# Patient Record
Sex: Female | Born: 1960 | Race: White | Hispanic: No | Smoking: Former smoker
Health system: Southern US, Community
[De-identification: ages and names within clinical notes are randomized; demographics above are authoritative.]

## PROBLEM LIST (undated history)

## (undated) DIAGNOSIS — J449 Chronic obstructive pulmonary disease, unspecified: Secondary | ICD-10-CM

## (undated) DIAGNOSIS — E785 Hyperlipidemia, unspecified: Secondary | ICD-10-CM

---

## 2017-04-03 DIAGNOSIS — Z7189 Other specified counseling: Secondary | ICD-10-CM | POA: Diagnosis not present

## 2017-04-03 DIAGNOSIS — E785 Hyperlipidemia, unspecified: Secondary | ICD-10-CM | POA: Diagnosis not present

## 2017-04-03 DIAGNOSIS — Z6831 Body mass index (BMI) 31.0-31.9, adult: Secondary | ICD-10-CM | POA: Diagnosis not present

## 2017-04-03 DIAGNOSIS — K5909 Other constipation: Secondary | ICD-10-CM | POA: Diagnosis not present

## 2017-04-03 DIAGNOSIS — N3281 Overactive bladder: Secondary | ICD-10-CM | POA: Diagnosis not present

## 2017-04-03 DIAGNOSIS — Z23 Encounter for immunization: Secondary | ICD-10-CM | POA: Diagnosis not present

## 2017-04-03 DIAGNOSIS — E039 Hypothyroidism, unspecified: Secondary | ICD-10-CM | POA: Diagnosis not present

## 2017-04-03 DIAGNOSIS — N951 Menopausal and female climacteric states: Secondary | ICD-10-CM | POA: Diagnosis not present

## 2017-04-17 DIAGNOSIS — J439 Emphysema, unspecified: Secondary | ICD-10-CM | POA: Diagnosis not present

## 2017-04-17 DIAGNOSIS — Z6830 Body mass index (BMI) 30.0-30.9, adult: Secondary | ICD-10-CM | POA: Diagnosis not present

## 2017-04-17 DIAGNOSIS — E039 Hypothyroidism, unspecified: Secondary | ICD-10-CM | POA: Diagnosis not present

## 2017-04-17 DIAGNOSIS — N3281 Overactive bladder: Secondary | ICD-10-CM | POA: Diagnosis not present

## 2017-04-17 DIAGNOSIS — K5909 Other constipation: Secondary | ICD-10-CM | POA: Diagnosis not present

## 2017-04-17 DIAGNOSIS — R079 Chest pain, unspecified: Secondary | ICD-10-CM | POA: Diagnosis not present

## 2017-04-17 DIAGNOSIS — N390 Urinary tract infection, site not specified: Secondary | ICD-10-CM | POA: Diagnosis not present

## 2017-04-17 DIAGNOSIS — E785 Hyperlipidemia, unspecified: Secondary | ICD-10-CM | POA: Diagnosis not present

## 2017-04-17 DIAGNOSIS — Z7189 Other specified counseling: Secondary | ICD-10-CM | POA: Diagnosis not present

## 2017-04-23 DIAGNOSIS — J449 Chronic obstructive pulmonary disease, unspecified: Secondary | ICD-10-CM | POA: Diagnosis not present

## 2017-05-14 DIAGNOSIS — R079 Chest pain, unspecified: Secondary | ICD-10-CM | POA: Diagnosis not present

## 2017-05-14 DIAGNOSIS — E785 Hyperlipidemia, unspecified: Secondary | ICD-10-CM | POA: Diagnosis not present

## 2017-05-14 DIAGNOSIS — Z79899 Other long term (current) drug therapy: Secondary | ICD-10-CM | POA: Diagnosis not present

## 2017-05-23 DIAGNOSIS — J449 Chronic obstructive pulmonary disease, unspecified: Secondary | ICD-10-CM | POA: Diagnosis not present

## 2017-05-29 DIAGNOSIS — E039 Hypothyroidism, unspecified: Secondary | ICD-10-CM | POA: Diagnosis not present

## 2017-05-29 DIAGNOSIS — Z6829 Body mass index (BMI) 29.0-29.9, adult: Secondary | ICD-10-CM | POA: Diagnosis not present

## 2017-05-29 DIAGNOSIS — E785 Hyperlipidemia, unspecified: Secondary | ICD-10-CM | POA: Diagnosis not present

## 2017-05-29 DIAGNOSIS — R69 Illness, unspecified: Secondary | ICD-10-CM | POA: Diagnosis not present

## 2017-05-29 DIAGNOSIS — Z7189 Other specified counseling: Secondary | ICD-10-CM | POA: Diagnosis not present

## 2017-05-29 DIAGNOSIS — J439 Emphysema, unspecified: Secondary | ICD-10-CM | POA: Diagnosis not present

## 2017-05-29 DIAGNOSIS — K219 Gastro-esophageal reflux disease without esophagitis: Secondary | ICD-10-CM | POA: Diagnosis not present

## 2017-06-02 DIAGNOSIS — Z9981 Dependence on supplemental oxygen: Secondary | ICD-10-CM | POA: Diagnosis not present

## 2017-06-02 DIAGNOSIS — J449 Chronic obstructive pulmonary disease, unspecified: Secondary | ICD-10-CM | POA: Diagnosis not present

## 2017-06-02 DIAGNOSIS — R0789 Other chest pain: Secondary | ICD-10-CM | POA: Diagnosis not present

## 2017-06-02 DIAGNOSIS — E7800 Pure hypercholesterolemia, unspecified: Secondary | ICD-10-CM | POA: Diagnosis not present

## 2017-06-02 DIAGNOSIS — Z79899 Other long term (current) drug therapy: Secondary | ICD-10-CM | POA: Diagnosis not present

## 2017-06-02 DIAGNOSIS — R079 Chest pain, unspecified: Secondary | ICD-10-CM | POA: Diagnosis not present

## 2017-06-02 DIAGNOSIS — Z7951 Long term (current) use of inhaled steroids: Secondary | ICD-10-CM | POA: Diagnosis not present

## 2017-06-02 DIAGNOSIS — Z87891 Personal history of nicotine dependence: Secondary | ICD-10-CM | POA: Diagnosis not present

## 2017-06-23 DIAGNOSIS — J449 Chronic obstructive pulmonary disease, unspecified: Secondary | ICD-10-CM | POA: Diagnosis not present

## 2017-07-24 DIAGNOSIS — J449 Chronic obstructive pulmonary disease, unspecified: Secondary | ICD-10-CM | POA: Diagnosis not present

## 2017-07-30 DIAGNOSIS — E039 Hypothyroidism, unspecified: Secondary | ICD-10-CM | POA: Diagnosis not present

## 2017-07-30 DIAGNOSIS — Z6830 Body mass index (BMI) 30.0-30.9, adult: Secondary | ICD-10-CM | POA: Diagnosis not present

## 2017-07-30 DIAGNOSIS — E785 Hyperlipidemia, unspecified: Secondary | ICD-10-CM | POA: Diagnosis not present

## 2017-07-30 DIAGNOSIS — N3281 Overactive bladder: Secondary | ICD-10-CM | POA: Diagnosis not present

## 2017-07-30 DIAGNOSIS — K219 Gastro-esophageal reflux disease without esophagitis: Secondary | ICD-10-CM | POA: Diagnosis not present

## 2017-07-30 DIAGNOSIS — R300 Dysuria: Secondary | ICD-10-CM | POA: Diagnosis not present

## 2017-07-30 DIAGNOSIS — R69 Illness, unspecified: Secondary | ICD-10-CM | POA: Diagnosis not present

## 2017-07-30 DIAGNOSIS — Z6829 Body mass index (BMI) 29.0-29.9, adult: Secondary | ICD-10-CM | POA: Diagnosis not present

## 2017-07-30 DIAGNOSIS — J439 Emphysema, unspecified: Secondary | ICD-10-CM | POA: Diagnosis not present

## 2017-07-30 DIAGNOSIS — Z7189 Other specified counseling: Secondary | ICD-10-CM | POA: Diagnosis not present

## 2017-08-06 DIAGNOSIS — J439 Emphysema, unspecified: Secondary | ICD-10-CM | POA: Diagnosis not present

## 2017-08-06 DIAGNOSIS — Z9181 History of falling: Secondary | ICD-10-CM | POA: Diagnosis not present

## 2017-08-06 DIAGNOSIS — N390 Urinary tract infection, site not specified: Secondary | ICD-10-CM | POA: Diagnosis not present

## 2017-08-14 DIAGNOSIS — J439 Emphysema, unspecified: Secondary | ICD-10-CM | POA: Diagnosis not present

## 2017-08-14 DIAGNOSIS — R079 Chest pain, unspecified: Secondary | ICD-10-CM | POA: Diagnosis not present

## 2017-08-14 DIAGNOSIS — Z7189 Other specified counseling: Secondary | ICD-10-CM | POA: Diagnosis not present

## 2017-08-14 DIAGNOSIS — K219 Gastro-esophageal reflux disease without esophagitis: Secondary | ICD-10-CM | POA: Diagnosis not present

## 2017-08-14 DIAGNOSIS — E785 Hyperlipidemia, unspecified: Secondary | ICD-10-CM | POA: Diagnosis not present

## 2017-08-14 DIAGNOSIS — Z6829 Body mass index (BMI) 29.0-29.9, adult: Secondary | ICD-10-CM | POA: Diagnosis not present

## 2017-08-21 DIAGNOSIS — J449 Chronic obstructive pulmonary disease, unspecified: Secondary | ICD-10-CM | POA: Diagnosis not present

## 2017-09-21 DIAGNOSIS — J449 Chronic obstructive pulmonary disease, unspecified: Secondary | ICD-10-CM | POA: Diagnosis not present

## 2017-09-24 DIAGNOSIS — Z7189 Other specified counseling: Secondary | ICD-10-CM | POA: Diagnosis not present

## 2017-09-24 DIAGNOSIS — Z6829 Body mass index (BMI) 29.0-29.9, adult: Secondary | ICD-10-CM | POA: Diagnosis not present

## 2017-09-24 DIAGNOSIS — J209 Acute bronchitis, unspecified: Secondary | ICD-10-CM | POA: Diagnosis not present

## 2017-09-24 DIAGNOSIS — J439 Emphysema, unspecified: Secondary | ICD-10-CM | POA: Diagnosis not present

## 2017-09-24 DIAGNOSIS — J441 Chronic obstructive pulmonary disease with (acute) exacerbation: Secondary | ICD-10-CM | POA: Diagnosis not present

## 2017-10-21 DIAGNOSIS — J449 Chronic obstructive pulmonary disease, unspecified: Secondary | ICD-10-CM | POA: Diagnosis not present

## 2017-11-19 DIAGNOSIS — Z1231 Encounter for screening mammogram for malignant neoplasm of breast: Secondary | ICD-10-CM | POA: Diagnosis not present

## 2017-11-21 DIAGNOSIS — J449 Chronic obstructive pulmonary disease, unspecified: Secondary | ICD-10-CM | POA: Diagnosis not present

## 2017-12-04 DIAGNOSIS — R0902 Hypoxemia: Secondary | ICD-10-CM | POA: Diagnosis not present

## 2017-12-04 DIAGNOSIS — R0602 Shortness of breath: Secondary | ICD-10-CM | POA: Diagnosis not present

## 2017-12-04 DIAGNOSIS — J449 Chronic obstructive pulmonary disease, unspecified: Secondary | ICD-10-CM | POA: Diagnosis not present

## 2017-12-04 DIAGNOSIS — G4733 Obstructive sleep apnea (adult) (pediatric): Secondary | ICD-10-CM | POA: Diagnosis not present

## 2017-12-04 DIAGNOSIS — Z87891 Personal history of nicotine dependence: Secondary | ICD-10-CM | POA: Diagnosis not present

## 2017-12-21 DIAGNOSIS — J449 Chronic obstructive pulmonary disease, unspecified: Secondary | ICD-10-CM | POA: Diagnosis not present

## 2018-01-21 DIAGNOSIS — J449 Chronic obstructive pulmonary disease, unspecified: Secondary | ICD-10-CM | POA: Diagnosis not present

## 2018-02-05 DIAGNOSIS — S4991XA Unspecified injury of right shoulder and upper arm, initial encounter: Secondary | ICD-10-CM | POA: Diagnosis not present

## 2018-02-05 DIAGNOSIS — J449 Chronic obstructive pulmonary disease, unspecified: Secondary | ICD-10-CM | POA: Diagnosis not present

## 2018-02-05 DIAGNOSIS — S76011A Strain of muscle, fascia and tendon of right hip, initial encounter: Secondary | ICD-10-CM | POA: Diagnosis not present

## 2018-02-05 DIAGNOSIS — S43401A Unspecified sprain of right shoulder joint, initial encounter: Secondary | ICD-10-CM | POA: Diagnosis not present

## 2018-02-05 DIAGNOSIS — K219 Gastro-esophageal reflux disease without esophagitis: Secondary | ICD-10-CM | POA: Diagnosis not present

## 2018-02-05 DIAGNOSIS — Z87891 Personal history of nicotine dependence: Secondary | ICD-10-CM | POA: Diagnosis not present

## 2018-02-05 DIAGNOSIS — S79911A Unspecified injury of right hip, initial encounter: Secondary | ICD-10-CM | POA: Diagnosis not present

## 2018-02-05 DIAGNOSIS — Z7982 Long term (current) use of aspirin: Secondary | ICD-10-CM | POA: Diagnosis not present

## 2018-02-05 DIAGNOSIS — Z79899 Other long term (current) drug therapy: Secondary | ICD-10-CM | POA: Diagnosis not present

## 2018-02-18 DIAGNOSIS — Z7189 Other specified counseling: Secondary | ICD-10-CM | POA: Diagnosis not present

## 2018-02-18 DIAGNOSIS — K219 Gastro-esophageal reflux disease without esophagitis: Secondary | ICD-10-CM | POA: Diagnosis not present

## 2018-02-18 DIAGNOSIS — E039 Hypothyroidism, unspecified: Secondary | ICD-10-CM | POA: Diagnosis not present

## 2018-02-18 DIAGNOSIS — J449 Chronic obstructive pulmonary disease, unspecified: Secondary | ICD-10-CM | POA: Diagnosis not present

## 2018-02-18 DIAGNOSIS — Z1379 Encounter for other screening for genetic and chromosomal anomalies: Secondary | ICD-10-CM | POA: Diagnosis not present

## 2018-02-18 DIAGNOSIS — R69 Illness, unspecified: Secondary | ICD-10-CM | POA: Diagnosis not present

## 2018-02-18 DIAGNOSIS — Z6830 Body mass index (BMI) 30.0-30.9, adult: Secondary | ICD-10-CM | POA: Diagnosis not present

## 2018-02-18 DIAGNOSIS — E785 Hyperlipidemia, unspecified: Secondary | ICD-10-CM | POA: Diagnosis not present

## 2018-02-21 DIAGNOSIS — J449 Chronic obstructive pulmonary disease, unspecified: Secondary | ICD-10-CM | POA: Diagnosis not present

## 2018-03-03 DIAGNOSIS — R69 Illness, unspecified: Secondary | ICD-10-CM | POA: Diagnosis not present

## 2018-03-03 DIAGNOSIS — J439 Emphysema, unspecified: Secondary | ICD-10-CM | POA: Diagnosis not present

## 2018-03-03 DIAGNOSIS — E039 Hypothyroidism, unspecified: Secondary | ICD-10-CM | POA: Diagnosis not present

## 2018-03-03 DIAGNOSIS — K219 Gastro-esophageal reflux disease without esophagitis: Secondary | ICD-10-CM | POA: Diagnosis not present

## 2018-03-03 DIAGNOSIS — R030 Elevated blood-pressure reading, without diagnosis of hypertension: Secondary | ICD-10-CM | POA: Diagnosis not present

## 2018-03-03 DIAGNOSIS — J449 Chronic obstructive pulmonary disease, unspecified: Secondary | ICD-10-CM | POA: Diagnosis not present

## 2018-03-03 DIAGNOSIS — E785 Hyperlipidemia, unspecified: Secondary | ICD-10-CM | POA: Diagnosis not present

## 2018-03-03 DIAGNOSIS — R0902 Hypoxemia: Secondary | ICD-10-CM | POA: Diagnosis not present

## 2018-03-03 DIAGNOSIS — E669 Obesity, unspecified: Secondary | ICD-10-CM | POA: Diagnosis not present

## 2018-03-23 DIAGNOSIS — J449 Chronic obstructive pulmonary disease, unspecified: Secondary | ICD-10-CM | POA: Diagnosis not present

## 2018-04-09 ENCOUNTER — Emergency Department
Admission: EM | Admit: 2018-04-09 | Discharge: 2018-04-09 | Disposition: A | Discharge status: Home / Self Care | Payer: Medicare HMO | Source: Home / Self Care

## 2018-04-09 ENCOUNTER — Encounter: Payer: Self-pay | Admitting: Emergency Medicine

## 2018-04-09 DIAGNOSIS — J029 Acute pharyngitis, unspecified: Secondary | ICD-10-CM

## 2018-04-09 DIAGNOSIS — Z008 Encounter for other general examination: Secondary | ICD-10-CM | POA: Diagnosis not present

## 2018-04-09 DIAGNOSIS — J438 Other emphysema: Secondary | ICD-10-CM

## 2018-04-09 DIAGNOSIS — J040 Acute laryngitis: Secondary | ICD-10-CM | POA: Diagnosis not present

## 2018-04-09 HISTORY — DX: Chronic obstructive pulmonary disease, unspecified: J44.9

## 2018-04-09 HISTORY — DX: Hyperlipidemia, unspecified: E78.5

## 2018-04-09 LAB — POCT INFLUENZA A/B
Influenza A, POC: NEGATIVE
Influenza B, POC: NEGATIVE

## 2018-04-09 LAB — POCT RAPID STREP A (OFFICE): RAPID STREP A SCREEN: NEGATIVE

## 2018-04-09 MED ORDER — BENZONATATE 100 MG PO CAPS: 100.0000 mg | ORAL_CAPSULE | Freq: Three times a day (TID) | ORAL | 0 refills | Status: DC | PRN

## 2018-04-09 MED ORDER — PREDNISONE 10 MG (21) PO TBPK: ORAL_TABLET | ORAL | 0 refills | Status: DC

## 2018-04-09 NOTE — Discharge Instructions (Addendum)
Take Tessalon Perles as needed for cough. Take prednisone taper as instructed. No change in other medications. Be sure you follow-up with your primary care physician in 48  to 72  hours if not improving. Please go to the emergency room  you develop worsening shortness of breath.

## 2018-04-09 NOTE — ED Triage Notes (Signed)
PT reports cough, congestion, sore throat for 3 days. History of COPD, is wearing O2.

## 2018-04-09 NOTE — ED Provider Notes (Signed)
[facility]    CSN: [id number] Arrival date & time: 04/09/18  1121     History   Chief Complaint Chief Complaint  Patient presents with  . Sore Throat    HPI Misty Lester is a 57 y.o. female.  Patient was in her usual state of health until Monday which was 4 days ago.She initially developed a scratchy throat. This persisted for 36 hours and then developed laryngitis. She has had clear rhinorrhea. She has had a cough which has been minimally productive. She has severe COPD. She is on breo inhaler, albuterol inhaler and is instructed to use a nebulizer 4  Times a day. She is on  O2 at 2 liters . HPI  Past Medical History:  Diagnosis Date  . COPD (chronic obstructive pulmonary disease) (HCC)   . Hyperlipemia     There are no active problems to display for this patient.   History reviewed. No pertinent surgical history.  OB History   None      Home Medications    Prior to Admission medications   Medication Sig Start Date End Date Taking? Authorizing Provider  albuterol (PROVENTIL) (2.5 MG/3ML) 0.083% nebulizer solution Take 2.5 mg by nebulization every 6 (six) hours as needed for wheezing or shortness of breath.   Yes [provider]  fluticasone furoate-vilanterol (BREO ELLIPTA) 200-25 MCG/INH AEPB Inhale 1 puff into the lungs daily.   Yes [provider]  levothyroxine (SYNTHROID, LEVOTHROID) 50 MCG tablet Take 50 mcg by mouth daily before breakfast.   Yes [provider]  Omega-3 Fatty Acids (FISH OIL) 1000 MG CAPS Take by mouth.   Yes [provider]  omeprazole (PRILOSEC) 40 MG capsule Take 40 mg by mouth daily.   Yes [provider]  Pitavastatin Calcium (LIVALO) 4 MG TABS Take by mouth.   Yes [provider]  benzonatate (TESSALON) 100 MG capsule Take 1-2 capsules (100-200 mg total) by mouth 3 (three) times daily as needed for cough. 04/09/18   Collene Gobble, MD  predniSONE (STERAPRED UNI-PAK 21  TAB) 10 MG (21) TBPK tablet Take 3 a day for 2 days then 2 a day for 2 days then one a day for 2 days 04/09/18   Collene Gobble, MD    Family History No family history on file.  Social History Social History   Tobacco Use  . Smoking status: Not on file  Substance Use Topics  . Alcohol use: Not Currently  . Drug use: Never     Allergies   Patient has no known allergies.   Review of Systems Review of Systems  HENT: Positive for congestion, rhinorrhea and sore throat.   Respiratory: Positive for cough, chest tightness and shortness of breath.   Cardiovascular: Negative.   Gastrointestinal: Negative.      Physical Exam Triage Vital Signs ED Triage Vitals  Enc Vitals Group     BP 04/09/18 1147 112/70     Pulse Rate 04/09/18 1147 99     Resp 04/09/18 1147 (!) 22     Temp 04/09/18 1147 98 F (36.7 C)     Temp Source 04/09/18 1147 Oral     SpO2 04/09/18 1147 94 %     Weight 04/09/18 1148 171 lb (77.6 kg)     Height --      Head Circumference --      Peak Flow --      Pain Score 04/09/18 1148 8     Pain Loc --  Pain Edu? --      Excl. in GC? --    No data found.  Updated Vital Signs BP 112/70   Pulse 99   Temp 98 F (36.7 C) (Oral)   Resp (!) 22   Wt 77.6 kg   SpO2 94%   Visual Acuity Right Eye Distance:   Left Eye Distance:   Bilateral Distance:    Right Eye Near:   Left Eye Near:    Bilateral Near:     Physical Exam  Constitutional: She appears well-developed and well-nourished.  HENT:  Right Ear: Tympanic membrane normal.  Left Ear: Tympanic membrane normal.  Mouth/Throat: Oropharynx is clear and moist and mucous membranes are normal. No oropharyngeal exudate or tonsillar abscesses.  She does have laryngitis  Eyes: Pupils are equal, round, and reactive to light.  Neck: Normal range of motion.  Cardiovascular: Normal rate and normal heart sounds.  Pulmonary/Chest:  Increased AP diameter.There are wheezes present bilaterally with dry  rhonchi. There are no retractions     UC Treatments / Results  Labs (all labs ordered are listed, but only abnormal results are displayed) Labs Reviewed  STREP A DNA PROBE  POCT RAPID STREP A (OFFICE)  POCT INFLUENZA A/B    EKG None  Radiology No results found.  Procedures Procedures (including critical care time)  Medications Ordered in UC Medications - No data to display  Initial Impression / Assessment and Plan / UC Course  I have reviewed the triage vital signs and the nursing notes.  Pertinent labs & imaging results that were available during my care of the patient were reviewed by me and considered in my medical decision making (see chart for details). Patient appears to have a viral illness. Flu test negative. Strep test negative. I am concerned she could flare of her underlying O2 dependent COPD. She will be on a short taper of prednisone along with Jerilynn Som for cough with follow-up with her primary care physician as needed.     Final Clinical Impressions(s) / UC Diagnoses   Final diagnoses:  Pharyngitis, unspecified etiology  Laryngitis  Other emphysema (HCC)     Discharge Instructions     Take Tessalon Perles as needed for cough. Take prednisone taper as instructed. No change in other medications. Be sure you follow-up with your primary care physician in 48  to 72  hours if not improving. Please go to the emergency room  you develop worsening shortness of breath.    ED Prescriptions    Medication Sig Dispense Auth. Provider   benzonatate (TESSALON) 100 MG capsule Take 1-2 capsules (100-200 mg total) by mouth 3 (three) times daily as needed for cough. 40 capsule Collene Gobble, MD   predniSONE (STERAPRED UNI-PAK 21 TAB) 10 MG (21) TBPK tablet Take 3 a day for 2 days then 2 a day for 2 days then one a day for 2 days 12 tablet Devarious Pavek, Maylon Peppers, MD     Controlled Substance Prescriptions [place] Controlled Substance Registry consulted? Not Applicable     Collene Gobble, MD 04/09/18 1242

## 2018-04-10 ENCOUNTER — Telehealth: Payer: Self-pay

## 2018-04-10 LAB — STREP A DNA PROBE: GROUP A STREP PROBE: NOT DETECTED

## 2018-04-10 NOTE — Telephone Encounter (Signed)
Pt notified of normal tcx results. Advised to call back if any questions.

## 2018-04-23 DIAGNOSIS — Z6830 Body mass index (BMI) 30.0-30.9, adult: Secondary | ICD-10-CM | POA: Diagnosis not present

## 2018-04-23 DIAGNOSIS — Z7189 Other specified counseling: Secondary | ICD-10-CM | POA: Diagnosis not present

## 2018-04-23 DIAGNOSIS — Z01419 Encounter for gynecological examination (general) (routine) without abnormal findings: Secondary | ICD-10-CM | POA: Diagnosis not present

## 2018-04-23 DIAGNOSIS — J449 Chronic obstructive pulmonary disease, unspecified: Secondary | ICD-10-CM | POA: Diagnosis not present

## 2018-04-27 DIAGNOSIS — N390 Urinary tract infection, site not specified: Secondary | ICD-10-CM | POA: Diagnosis not present

## 2018-05-04 DIAGNOSIS — J441 Chronic obstructive pulmonary disease with (acute) exacerbation: Secondary | ICD-10-CM | POA: Diagnosis not present

## 2018-05-04 DIAGNOSIS — Z7189 Other specified counseling: Secondary | ICD-10-CM | POA: Diagnosis not present

## 2018-05-12 DIAGNOSIS — H524 Presbyopia: Secondary | ICD-10-CM | POA: Diagnosis not present

## 2018-05-12 DIAGNOSIS — H5203 Hypermetropia, bilateral: Secondary | ICD-10-CM | POA: Diagnosis not present

## 2018-05-12 DIAGNOSIS — H52209 Unspecified astigmatism, unspecified eye: Secondary | ICD-10-CM | POA: Diagnosis not present

## 2018-05-23 DIAGNOSIS — J449 Chronic obstructive pulmonary disease, unspecified: Secondary | ICD-10-CM | POA: Diagnosis not present

## 2018-05-28 ENCOUNTER — Emergency Department (INDEPENDENT_AMBULATORY_CARE_PROVIDER_SITE_OTHER): Payer: Medicare HMO

## 2018-05-28 ENCOUNTER — Emergency Department (INDEPENDENT_AMBULATORY_CARE_PROVIDER_SITE_OTHER)
Admission: EM | Admit: 2018-05-28 | Discharge: 2018-05-28 | Disposition: A | Discharge status: Home / Self Care | Payer: Medicare HMO | Source: Home / Self Care

## 2018-05-28 ENCOUNTER — Other Ambulatory Visit: Payer: Self-pay

## 2018-05-28 DIAGNOSIS — I700 Atherosclerosis of aorta: Secondary | ICD-10-CM | POA: Diagnosis not present

## 2018-05-28 DIAGNOSIS — J209 Acute bronchitis, unspecified: Secondary | ICD-10-CM | POA: Diagnosis not present

## 2018-05-28 DIAGNOSIS — J441 Chronic obstructive pulmonary disease with (acute) exacerbation: Secondary | ICD-10-CM

## 2018-05-28 DIAGNOSIS — R05 Cough: Secondary | ICD-10-CM | POA: Diagnosis not present

## 2018-05-28 MED ORDER — METHYLPREDNISOLONE SODIUM SUCC 125 MG IJ SOLR: 80.0000 mg | Freq: Once | INTRAMUSCULAR | Status: DC

## 2018-05-28 MED ORDER — OFLOXACIN 400 MG PO TABS: 400.0000 mg | ORAL_TABLET | Freq: Two times a day (BID) | ORAL | 0 refills | Status: AC

## 2018-05-28 MED ORDER — METHYLPREDNISOLONE ACETATE 80 MG/ML IJ SUSP
80.0000 mg | Freq: Once | INTRAMUSCULAR | Status: AC
  Administered 2018-05-28: 80 mg via INTRAMUSCULAR

## 2018-05-28 NOTE — Discharge Instructions (Addendum)
The x-ray shows no pneumonia.  Nevertheless your exam and history are consistent with an exacerbation of COPD.  For that reason, we are giving you another round of steroids in the form of a shot as well as a different antibiotic in hopes that he will see improvement in the next 24 hours.  You may need to see your lung doctor if symptoms do not resolve with this treatment.

## 2018-05-28 NOTE — ED Triage Notes (Signed)
Misty Lester complains of cough for the last 2 months. She has been on antibiotics, prednisone, cough medications and nebulizer treatments. She did start to feel better. However, the cough has been consistent for the last 2 months.

## 2018-05-28 NOTE — ED Provider Notes (Signed)
[facility]    CSN: [id number] Arrival date & time: 05/28/18  1411     History   Chief Complaint Chief Complaint  Patient presents with  . Cough    HPI Misty Lester is a 57 y.o. female.   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  She has a history of COPD and hyperlipidemia.  Patient was seen 2 months ago with an exacerbation of COPD.  Patient says that she has had heaviness in her chest and shortness of breath for about a week.  She had chills last night.  She had some nausea this morning.  There is been no vomiting however.  Patient has been taking all of her pulmonary toilet.  She states that in October she was given a prednisone Dosepak which helped but then she went backwards.  About a month ago she saw her doctor in the [place], [place], who gave her a shot of antibiotics and a shot of steroids.  Once again she seemed to improve for a week or 2 only to gradually deteriorate again Patient describes a heaviness is something that she is endured in the past when she has had bronchitis.  Patient's risk factors for heart disease include hyperlipidemia and history of smoking.  Her last cigarette was 10 years ago.  Her past FEV1 was 33%.  At baseline, patient has a 97% saturation on 2 L.  She has been diagnosed with centrilobular emphysema.  She did have a flu shot on 04/09/18.     Past Medical History:  Diagnosis Date  . COPD (chronic obstructive pulmonary disease) (HCC)   . Hyperlipemia     There are no active problems to display for this patient.   History reviewed. No pertinent surgical history.  OB History   None      Home Medications    Prior to Admission medications   Medication Sig Start Date End Date Taking? Authorizing Provider  albuterol (PROVENTIL) (2.5 MG/3ML) 0.083% nebulizer solution Take 2.5 mg by nebulization every 6 (six) hours as needed for wheezing or shortness of breath.    Yes [provider]  fluticasone furoate-vilanterol (BREO ELLIPTA) 200-25 MCG/INH AEPB Inhale 1 puff into the lungs daily.   Yes [provider]  levothyroxine (SYNTHROID, LEVOTHROID) 50 MCG tablet Take 50 mcg by mouth daily before breakfast.   Yes [provider]  Omega-3 Fatty Acids (FISH OIL) 1000 MG CAPS Take by mouth.   Yes [provider]  omeprazole (PRILOSEC) 40 MG capsule Take 40 mg by mouth daily.   Yes [provider]  Pitavastatin Calcium (LIVALO) 4 MG TABS Take by mouth.   Yes [provider]  ofloxacin (FLOXIN) 400 MG tablet Take 1 tablet (400 mg total) by mouth 2 (two) times daily. 05/28/18   Elvina Sidle, MD    Family History History reviewed. No pertinent family history.  Social History Social History   Tobacco Use  . Smoking status: Former Smoker    Packs/day: 1.00    Years: 30.00    Pack years: 30.00    Types: Cigarettes  . Smokeless tobacco: Never Used  Substance Use Topics  . Alcohol use: Not Currently  . Drug use: Never     Allergies   Patient has no known allergies.   Review of Systems Review of Systems   Physical Exam Triage Vital Signs ED Triage Vitals  Enc Vitals Group     BP  Pulse      Resp      Temp      Temp src      SpO2      Weight      Height      Head Circumference      Peak Flow      Pain Score      Pain Loc      Pain Edu?      Excl. in GC?    No data found.  Updated Vital Signs BP 108/73 (BP Location: Left Arm)   Pulse (!) 108   Temp 98 F (36.7 C) (Oral)   Ht 5\' 1"  (1.549 m)   Wt 77.1 kg   SpO2 92%   BMI 32.12 kg/m    Physical Exam  Constitutional: She is oriented to person, place, and time. She appears well-developed and well-nourished.  HENT:  Head: Normocephalic.  Right Ear: External ear normal.  Left Ear: External ear normal.  Mouth/Throat: Oropharynx is clear and moist.  Edentulous  Eyes: Conjunctivae are normal.  Neck: Normal range of  motion. Neck supple. No JVD present.  Cardiovascular: Normal rate, regular rhythm and normal heart sounds.  Pulmonary/Chest:  Mildly prolonged expiratory phase of respiration, bilateral expiratory wheezes.  Diminished lung sounds throughout all lung fields.  No rales heard at the bases  Abdominal: Soft.  Musculoskeletal: Normal range of motion.  Neurological: She is alert and oriented to person, place, and time.  Skin: Skin is warm and dry.  Psychiatric: She has a normal mood and affect.  Nursing note and vitals reviewed.    UC Treatments / Results  Labs (all labs ordered are listed, but only abnormal results are displayed) Labs Reviewed - No data to display  EKG None  Radiology Dg Chest 2 View  Result Date: 05/28/2018 CLINICAL DATA:  Productive cough x2 months. EXAM: CHEST - 2 VIEW COMPARISON:  None. FINDINGS: Heart size and mediastinal contours within limits. Nonaneurysmal atherosclerotic aorta is noted. Mild emphysematous hyperinflation of the lungs with coarsened interstitial lung markings bilaterally. Visible consolidation, effusion or overt edema. Left basilar atelectasis is noted. No acute osseous abnormality. IMPRESSION: 1. Pulmonary hyperinflation, upper lobe predominant. 2. No active pulmonary disease. 3. Aortic atherosclerosis. Electronically Signed   By: Tollie Eth M.D.   On: 05/28/2018 15:02    Procedures Procedures (including critical care time)  Medications Ordered in UC Medications  methylPREDNISolone sodium succinate (SOLU-MEDROL) 125 mg/2 mL injection 80 mg (has no administration in time range)    Initial Impression / Assessment and Plan / UC Course  I have reviewed the triage vital signs and the nursing notes.  Pertinent labs & imaging results that were available during my care of the patient were reviewed by me and considered in my medical decision making (see chart for details).     Final Clinical Impressions(s) / UC Diagnoses   Final diagnoses:    Acute bronchitis, unspecified organism  COPD exacerbation [facility])     Discharge Instructions     The x-ray shows no pneumonia.  Nevertheless your exam and history are consistent with an exacerbation of COPD.  For that reason, we are giving you another round of steroids in the form of a shot as well as a different antibiotic in hopes that he will see improvement in the next 24 hours.  You may need to see your lung doctor if symptoms do not resolve with this treatment.    ED Prescriptions    Medication Sig  Dispense Auth. Provider   ofloxacin (FLOXIN) 400 MG tablet Take 1 tablet (400 mg total) by mouth 2 (two) times daily. 7 tablet Elvina SidleLauenstein, Mann Skaggs, MD     Controlled Substance Prescriptions [place] Controlled Substance Registry consulted? Not Applicable   Elvina SidleLauenstein, Kellene Mccleary, MD 05/28/18 1510

## 2018-06-01 DIAGNOSIS — R079 Chest pain, unspecified: Secondary | ICD-10-CM | POA: Diagnosis not present

## 2018-06-01 DIAGNOSIS — Z7951 Long term (current) use of inhaled steroids: Secondary | ICD-10-CM | POA: Diagnosis not present

## 2018-06-01 DIAGNOSIS — J449 Chronic obstructive pulmonary disease, unspecified: Secondary | ICD-10-CM | POA: Diagnosis not present

## 2018-06-01 DIAGNOSIS — Z87891 Personal history of nicotine dependence: Secondary | ICD-10-CM | POA: Diagnosis not present

## 2018-06-01 DIAGNOSIS — R4781 Slurred speech: Secondary | ICD-10-CM | POA: Diagnosis not present

## 2018-06-01 DIAGNOSIS — R42 Dizziness and giddiness: Secondary | ICD-10-CM | POA: Diagnosis not present

## 2018-06-01 DIAGNOSIS — Z79899 Other long term (current) drug therapy: Secondary | ICD-10-CM | POA: Diagnosis not present

## 2018-06-01 DIAGNOSIS — R531 Weakness: Secondary | ICD-10-CM | POA: Diagnosis not present

## 2018-06-01 DIAGNOSIS — K219 Gastro-esophageal reflux disease without esophagitis: Secondary | ICD-10-CM | POA: Diagnosis not present

## 2018-06-01 DIAGNOSIS — Z7982 Long term (current) use of aspirin: Secondary | ICD-10-CM | POA: Diagnosis not present

## 2018-06-01 DIAGNOSIS — Z791 Long term (current) use of non-steroidal anti-inflammatories (NSAID): Secondary | ICD-10-CM | POA: Diagnosis not present

## 2018-06-23 DIAGNOSIS — J449 Chronic obstructive pulmonary disease, unspecified: Secondary | ICD-10-CM | POA: Diagnosis not present

## 2018-06-26 DIAGNOSIS — J441 Chronic obstructive pulmonary disease with (acute) exacerbation: Secondary | ICD-10-CM | POA: Diagnosis not present

## 2018-06-26 DIAGNOSIS — E039 Hypothyroidism, unspecified: Secondary | ICD-10-CM | POA: Diagnosis not present

## 2018-06-26 DIAGNOSIS — Z7189 Other specified counseling: Secondary | ICD-10-CM | POA: Diagnosis not present

## 2018-06-26 DIAGNOSIS — R55 Syncope and collapse: Secondary | ICD-10-CM | POA: Diagnosis not present

## 2018-06-26 DIAGNOSIS — Z1331 Encounter for screening for depression: Secondary | ICD-10-CM | POA: Diagnosis not present

## 2018-06-26 DIAGNOSIS — Z6832 Body mass index (BMI) 32.0-32.9, adult: Secondary | ICD-10-CM | POA: Diagnosis not present

## 2018-06-26 DIAGNOSIS — R5383 Other fatigue: Secondary | ICD-10-CM | POA: Diagnosis not present

## 2018-06-26 DIAGNOSIS — E538 Deficiency of other specified B group vitamins: Secondary | ICD-10-CM | POA: Diagnosis not present

## 2018-06-26 DIAGNOSIS — R42 Dizziness and giddiness: Secondary | ICD-10-CM | POA: Diagnosis not present

## 2018-07-02 DIAGNOSIS — J432 Centrilobular emphysema: Secondary | ICD-10-CM | POA: Diagnosis not present

## 2018-07-02 DIAGNOSIS — Z23 Encounter for immunization: Secondary | ICD-10-CM | POA: Diagnosis not present

## 2018-07-02 DIAGNOSIS — G4733 Obstructive sleep apnea (adult) (pediatric): Secondary | ICD-10-CM | POA: Diagnosis not present

## 2018-07-02 DIAGNOSIS — Z9981 Dependence on supplemental oxygen: Secondary | ICD-10-CM | POA: Diagnosis not present

## 2018-07-02 DIAGNOSIS — R0602 Shortness of breath: Secondary | ICD-10-CM | POA: Diagnosis not present

## 2018-07-02 DIAGNOSIS — Z87891 Personal history of nicotine dependence: Secondary | ICD-10-CM | POA: Diagnosis not present

## 2018-07-02 DIAGNOSIS — R0902 Hypoxemia: Secondary | ICD-10-CM | POA: Diagnosis not present

## 2018-07-02 DIAGNOSIS — Z9989 Dependence on other enabling machines and devices: Secondary | ICD-10-CM | POA: Diagnosis not present

## 2018-07-02 DIAGNOSIS — J441 Chronic obstructive pulmonary disease with (acute) exacerbation: Secondary | ICD-10-CM | POA: Diagnosis not present

## 2018-07-24 DIAGNOSIS — J449 Chronic obstructive pulmonary disease, unspecified: Secondary | ICD-10-CM | POA: Diagnosis not present

## 2018-08-22 DIAGNOSIS — J449 Chronic obstructive pulmonary disease, unspecified: Secondary | ICD-10-CM | POA: Diagnosis not present

## 2018-09-22 DIAGNOSIS — J449 Chronic obstructive pulmonary disease, unspecified: Secondary | ICD-10-CM | POA: Diagnosis not present

## 2018-10-01 DIAGNOSIS — Z79899 Other long term (current) drug therapy: Secondary | ICD-10-CM | POA: Diagnosis not present

## 2018-10-01 DIAGNOSIS — E785 Hyperlipidemia, unspecified: Secondary | ICD-10-CM | POA: Diagnosis not present

## 2018-10-01 DIAGNOSIS — E039 Hypothyroidism, unspecified: Secondary | ICD-10-CM | POA: Diagnosis not present

## 2020-04-28 IMAGING — DX DG CHEST 2V
2 series · 2 of 2 positions shown · non-contrast
Comparison: None.

CLINICAL DATA: Productive cough x2 months.

EXAM:
CHEST - 2 VIEW

[chest pa]
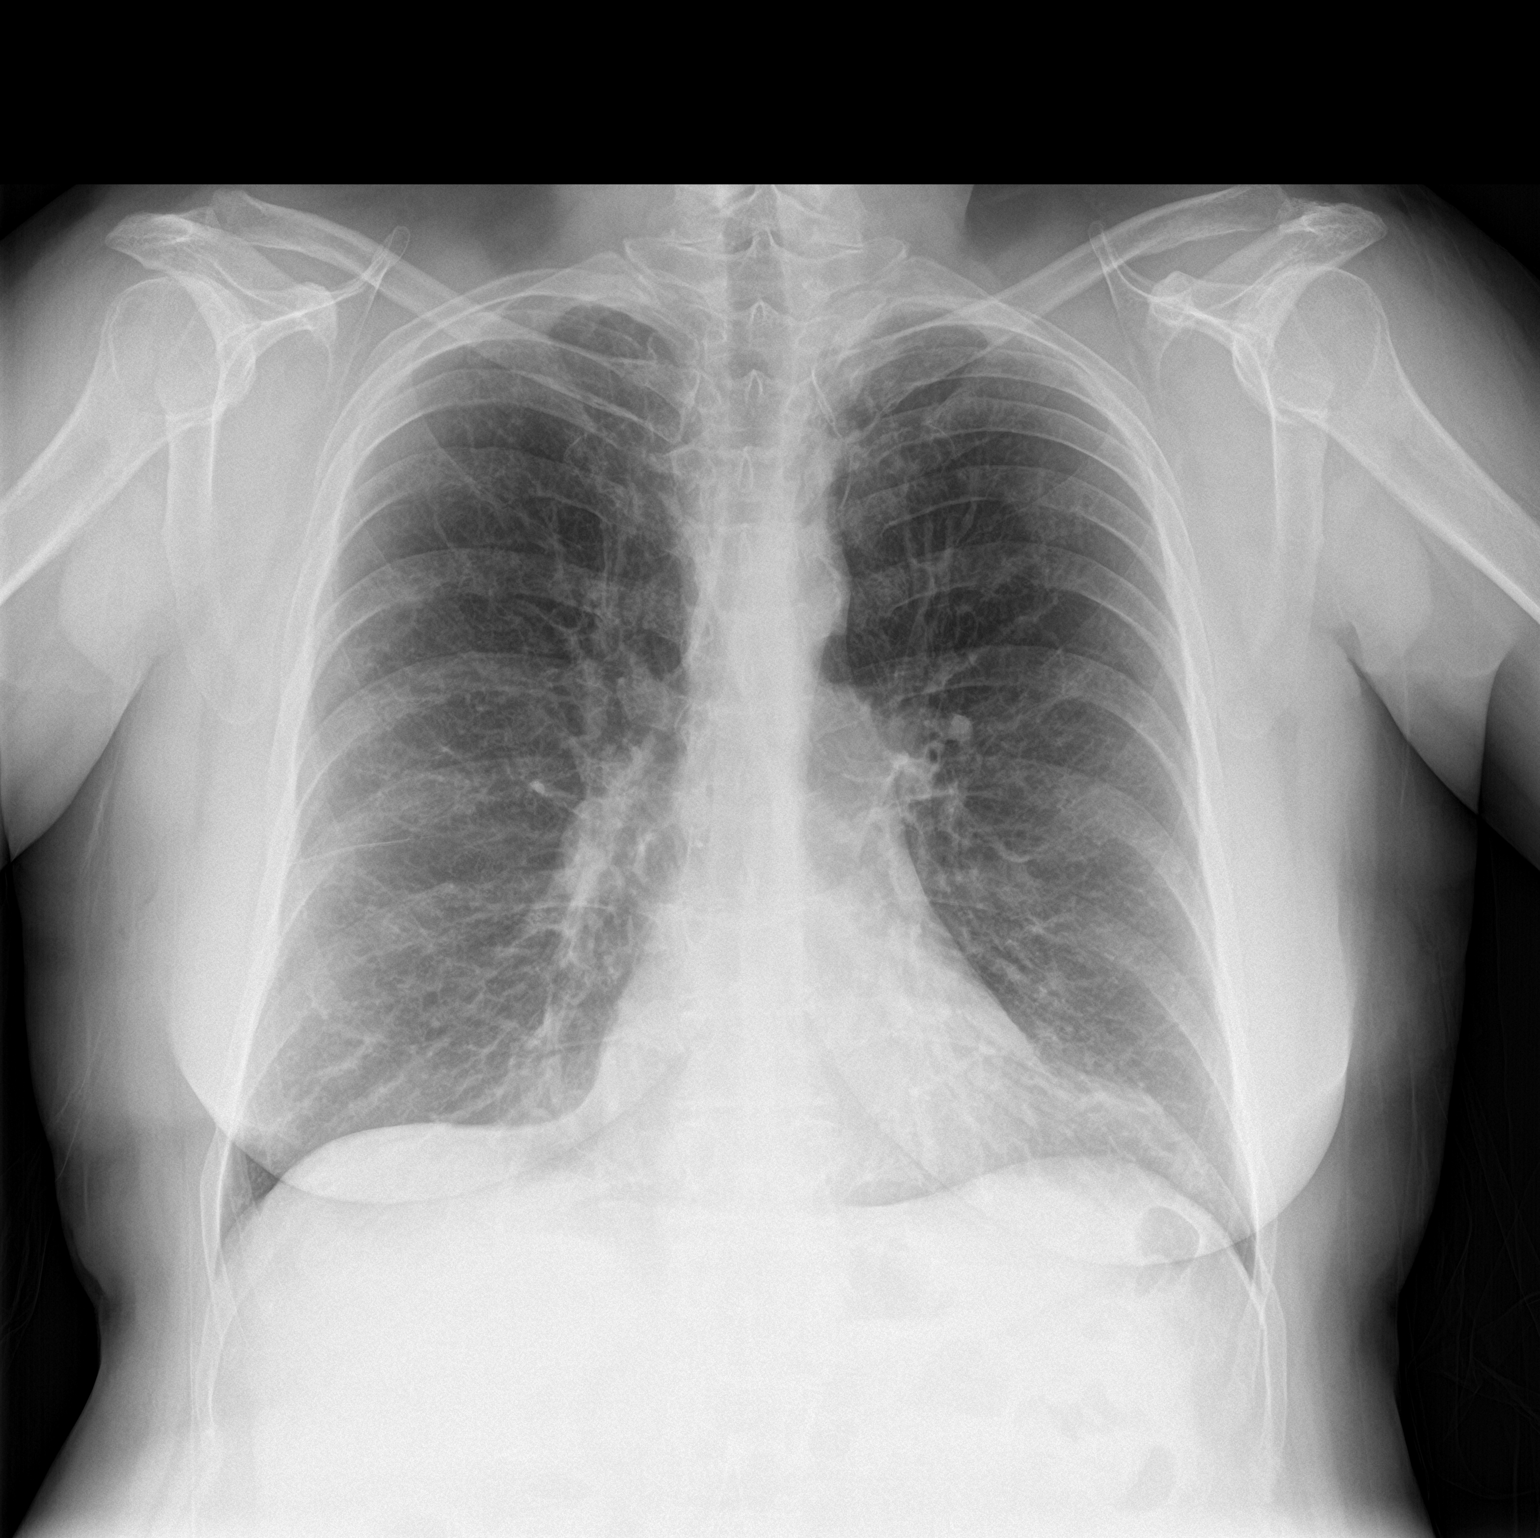

[chest lat]
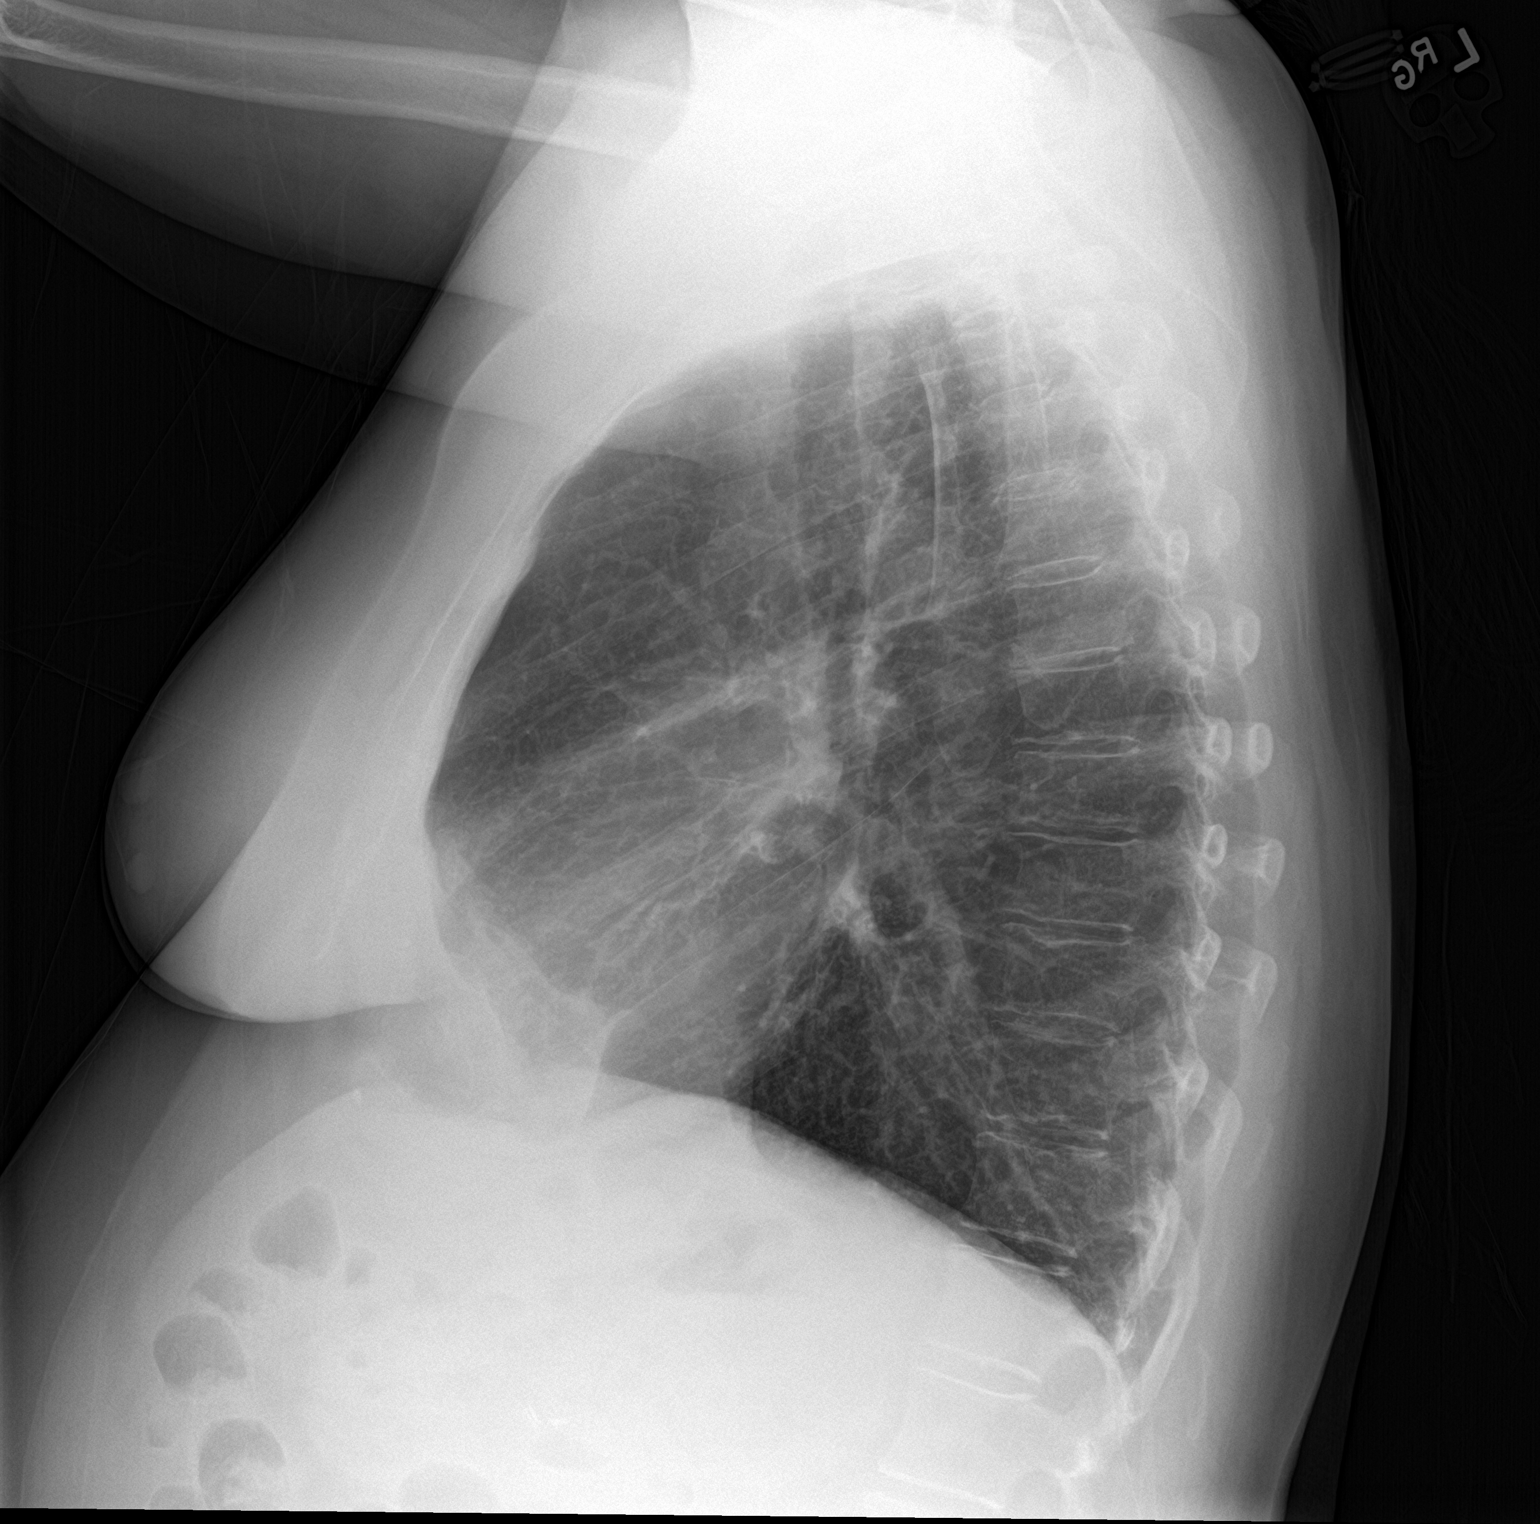

[2 of 2 positions shown; findings below may reference images not displayed]

FINDINGS: Heart size and mediastinal contours within limits. Nonaneurysmal
atherosclerotic aorta is noted. Mild emphysematous hyperinflation of
the lungs with coarsened interstitial lung markings bilaterally.
Visible consolidation, effusion or overt edema. Left basilar
atelectasis is noted. No acute osseous abnormality.
IMPRESSION: 1. Pulmonary hyperinflation, upper lobe predominant.
2. No active pulmonary disease.
3. Aortic atherosclerosis.
